# Patient Record
Sex: Female | Born: 2000
Health system: Southern US, Community
[De-identification: ages and names within clinical notes are randomized; demographics above are authoritative.]

---
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
## 2020-07-12 DIAGNOSIS — Z3042 Encounter for surveillance of injectable contraceptive: Secondary | ICD-10-CM | POA: Diagnosis not present

## 2020-07-12 DIAGNOSIS — Z30013 Encounter for initial prescription of injectable contraceptive: Secondary | ICD-10-CM | POA: Diagnosis not present

## 2020-07-12 DIAGNOSIS — Z3202 Encounter for pregnancy test, result negative: Secondary | ICD-10-CM | POA: Diagnosis not present

## 2020-10-31 DIAGNOSIS — Z23 Encounter for immunization: Secondary | ICD-10-CM | POA: Diagnosis not present

## 2021-01-03 DIAGNOSIS — Z113 Encounter for screening for infections with a predominantly sexual mode of transmission: Secondary | ICD-10-CM | POA: Diagnosis not present

## 2021-01-03 DIAGNOSIS — Z6824 Body mass index (BMI) 24.0-24.9, adult: Secondary | ICD-10-CM | POA: Diagnosis not present

## 2021-01-03 DIAGNOSIS — Z1331 Encounter for screening for depression: Secondary | ICD-10-CM | POA: Diagnosis not present

## 2021-01-03 DIAGNOSIS — Z713 Dietary counseling and surveillance: Secondary | ICD-10-CM | POA: Diagnosis not present

## 2021-01-03 DIAGNOSIS — Z0000 Encounter for general adult medical examination without abnormal findings: Secondary | ICD-10-CM | POA: Diagnosis not present

## 2021-08-15 DIAGNOSIS — Z30013 Encounter for initial prescription of injectable contraceptive: Secondary | ICD-10-CM | POA: Diagnosis not present

## 2021-08-18 DIAGNOSIS — Z7689 Persons encountering health services in other specified circumstances: Secondary | ICD-10-CM | POA: Diagnosis not present

## 2021-08-18 DIAGNOSIS — Z3042 Encounter for surveillance of injectable contraceptive: Secondary | ICD-10-CM | POA: Diagnosis not present

## 2021-10-19 DIAGNOSIS — F4322 Adjustment disorder with anxiety: Secondary | ICD-10-CM | POA: Diagnosis not present

## 2021-11-02 DIAGNOSIS — F4322 Adjustment disorder with anxiety: Secondary | ICD-10-CM | POA: Diagnosis not present

## 2021-11-02 DIAGNOSIS — Z23 Encounter for immunization: Secondary | ICD-10-CM | POA: Diagnosis not present

## 2021-11-10 DIAGNOSIS — F4322 Adjustment disorder with anxiety: Secondary | ICD-10-CM | POA: Diagnosis not present

## 2021-11-17 DIAGNOSIS — F4322 Adjustment disorder with anxiety: Secondary | ICD-10-CM | POA: Diagnosis not present

## 2021-11-23 DIAGNOSIS — F4322 Adjustment disorder with anxiety: Secondary | ICD-10-CM | POA: Diagnosis not present

## 2021-12-05 DIAGNOSIS — F4322 Adjustment disorder with anxiety: Secondary | ICD-10-CM | POA: Diagnosis not present

## 2021-12-12 DIAGNOSIS — F4322 Adjustment disorder with anxiety: Secondary | ICD-10-CM | POA: Diagnosis not present

## 2021-12-21 DIAGNOSIS — F4322 Adjustment disorder with anxiety: Secondary | ICD-10-CM | POA: Diagnosis not present

## 2021-12-26 DIAGNOSIS — F4322 Adjustment disorder with anxiety: Secondary | ICD-10-CM | POA: Diagnosis not present

## 2022-01-19 DIAGNOSIS — Z13220 Encounter for screening for lipoid disorders: Secondary | ICD-10-CM | POA: Diagnosis not present

## 2022-01-19 DIAGNOSIS — Z114 Encounter for screening for human immunodeficiency virus [HIV]: Secondary | ICD-10-CM | POA: Diagnosis not present

## 2022-01-19 DIAGNOSIS — Z0000 Encounter for general adult medical examination without abnormal findings: Secondary | ICD-10-CM | POA: Diagnosis not present

## 2022-01-23 DIAGNOSIS — Z0000 Encounter for general adult medical examination without abnormal findings: Secondary | ICD-10-CM | POA: Diagnosis not present

## 2022-01-23 DIAGNOSIS — Z113 Encounter for screening for infections with a predominantly sexual mode of transmission: Secondary | ICD-10-CM | POA: Diagnosis not present

## 2022-01-23 DIAGNOSIS — Z01419 Encounter for gynecological examination (general) (routine) without abnormal findings: Secondary | ICD-10-CM | POA: Diagnosis not present

## 2022-01-23 DIAGNOSIS — Z23 Encounter for immunization: Secondary | ICD-10-CM | POA: Diagnosis not present

## 2022-01-23 DIAGNOSIS — R87610 Atypical squamous cells of undetermined significance on cytologic smear of cervix (ASC-US): Secondary | ICD-10-CM | POA: Diagnosis not present

## 2022-02-01 DIAGNOSIS — F4322 Adjustment disorder with anxiety: Secondary | ICD-10-CM | POA: Diagnosis not present

## 2022-02-08 DIAGNOSIS — F4322 Adjustment disorder with anxiety: Secondary | ICD-10-CM | POA: Diagnosis not present

## 2022-02-15 DIAGNOSIS — F4322 Adjustment disorder with anxiety: Secondary | ICD-10-CM | POA: Diagnosis not present

## 2022-02-22 DIAGNOSIS — Z30430 Encounter for insertion of intrauterine contraceptive device: Secondary | ICD-10-CM | POA: Diagnosis not present

## 2022-02-22 DIAGNOSIS — Z3202 Encounter for pregnancy test, result negative: Secondary | ICD-10-CM | POA: Diagnosis not present

## 2022-02-22 DIAGNOSIS — F4322 Adjustment disorder with anxiety: Secondary | ICD-10-CM | POA: Diagnosis not present

## 2022-03-01 DIAGNOSIS — F4322 Adjustment disorder with anxiety: Secondary | ICD-10-CM | POA: Diagnosis not present

## 2022-03-08 DIAGNOSIS — F4322 Adjustment disorder with anxiety: Secondary | ICD-10-CM | POA: Diagnosis not present

## 2022-04-05 DIAGNOSIS — F4322 Adjustment disorder with anxiety: Secondary | ICD-10-CM | POA: Diagnosis not present

## 2022-05-09 DIAGNOSIS — F4322 Adjustment disorder with anxiety: Secondary | ICD-10-CM | POA: Diagnosis not present

## 2022-05-17 DIAGNOSIS — F4322 Adjustment disorder with anxiety: Secondary | ICD-10-CM | POA: Diagnosis not present

## 2022-05-22 DIAGNOSIS — F4322 Adjustment disorder with anxiety: Secondary | ICD-10-CM | POA: Diagnosis not present

## 2022-05-29 DIAGNOSIS — F4322 Adjustment disorder with anxiety: Secondary | ICD-10-CM | POA: Diagnosis not present

## 2022-06-04 DIAGNOSIS — F4322 Adjustment disorder with anxiety: Secondary | ICD-10-CM | POA: Diagnosis not present

## 2022-08-06 DIAGNOSIS — F4322 Adjustment disorder with anxiety: Secondary | ICD-10-CM | POA: Diagnosis not present

## 2022-08-23 DIAGNOSIS — L710 Perioral dermatitis: Secondary | ICD-10-CM | POA: Diagnosis not present

## 2022-08-27 DIAGNOSIS — F4322 Adjustment disorder with anxiety: Secondary | ICD-10-CM | POA: Diagnosis not present

## 2022-10-01 DIAGNOSIS — F4322 Adjustment disorder with anxiety: Secondary | ICD-10-CM | POA: Diagnosis not present

## 2022-10-31 DIAGNOSIS — F4322 Adjustment disorder with anxiety: Secondary | ICD-10-CM | POA: Diagnosis not present

## 2022-11-08 DIAGNOSIS — F4322 Adjustment disorder with anxiety: Secondary | ICD-10-CM | POA: Diagnosis not present

## 2022-11-15 DIAGNOSIS — L700 Acne vulgaris: Secondary | ICD-10-CM | POA: Diagnosis not present

## 2022-11-15 DIAGNOSIS — L710 Perioral dermatitis: Secondary | ICD-10-CM | POA: Diagnosis not present

## 2022-11-26 DIAGNOSIS — F4322 Adjustment disorder with anxiety: Secondary | ICD-10-CM | POA: Diagnosis not present

## 2022-12-04 DIAGNOSIS — F4322 Adjustment disorder with anxiety: Secondary | ICD-10-CM | POA: Diagnosis not present

## 2023-01-02 DIAGNOSIS — F4322 Adjustment disorder with anxiety: Secondary | ICD-10-CM | POA: Diagnosis not present

## 2023-01-22 DIAGNOSIS — Z13220 Encounter for screening for lipoid disorders: Secondary | ICD-10-CM | POA: Diagnosis not present

## 2023-01-22 DIAGNOSIS — Z0000 Encounter for general adult medical examination without abnormal findings: Secondary | ICD-10-CM | POA: Diagnosis not present

## 2023-01-22 DIAGNOSIS — Z114 Encounter for screening for human immunodeficiency virus [HIV]: Secondary | ICD-10-CM | POA: Diagnosis not present

## 2023-01-25 DIAGNOSIS — Z01411 Encounter for gynecological examination (general) (routine) with abnormal findings: Secondary | ICD-10-CM | POA: Diagnosis not present

## 2023-01-25 DIAGNOSIS — Z01419 Encounter for gynecological examination (general) (routine) without abnormal findings: Secondary | ICD-10-CM | POA: Diagnosis not present

## 2023-01-25 DIAGNOSIS — Z0000 Encounter for general adult medical examination without abnormal findings: Secondary | ICD-10-CM | POA: Diagnosis not present

## 2023-01-25 DIAGNOSIS — Z23 Encounter for immunization: Secondary | ICD-10-CM | POA: Diagnosis not present

## 2023-02-14 DIAGNOSIS — M25552 Pain in left hip: Secondary | ICD-10-CM | POA: Diagnosis not present

## 2023-02-14 DIAGNOSIS — M76892 Other specified enthesopathies of left lower limb, excluding foot: Secondary | ICD-10-CM | POA: Diagnosis not present

## 2023-03-05 DIAGNOSIS — F4322 Adjustment disorder with anxiety: Secondary | ICD-10-CM | POA: Diagnosis not present

## 2023-03-12 DIAGNOSIS — F4322 Adjustment disorder with anxiety: Secondary | ICD-10-CM | POA: Diagnosis not present

## 2023-04-16 DIAGNOSIS — F4322 Adjustment disorder with anxiety: Secondary | ICD-10-CM | POA: Diagnosis not present

## 2023-04-30 DIAGNOSIS — F4322 Adjustment disorder with anxiety: Secondary | ICD-10-CM | POA: Diagnosis not present

## 2023-05-14 DIAGNOSIS — F4322 Adjustment disorder with anxiety: Secondary | ICD-10-CM | POA: Diagnosis not present

## 2023-05-29 DIAGNOSIS — F4322 Adjustment disorder with anxiety: Secondary | ICD-10-CM | POA: Diagnosis not present

## 2023-06-11 DIAGNOSIS — F4322 Adjustment disorder with anxiety: Secondary | ICD-10-CM | POA: Diagnosis not present

## 2023-08-06 DIAGNOSIS — L710 Perioral dermatitis: Secondary | ICD-10-CM | POA: Diagnosis not present

## 2023-08-06 DIAGNOSIS — L700 Acne vulgaris: Secondary | ICD-10-CM | POA: Diagnosis not present

## 2023-11-08 DIAGNOSIS — L710 Perioral dermatitis: Secondary | ICD-10-CM | POA: Diagnosis not present

## 2023-11-08 DIAGNOSIS — L700 Acne vulgaris: Secondary | ICD-10-CM | POA: Diagnosis not present
